# Patient Record
Sex: Male | Born: 1980 | Race: Black or African American | Hispanic: No | Marital: Single | Smoking: Current every day smoker
Health system: Southern US, Community
[De-identification: ages and names within clinical notes are randomized; demographics above are authoritative.]

---

## 2000-02-08 ENCOUNTER — Emergency Department (HOSPITAL_COMMUNITY): Admission: EM | Admit: 2000-02-08 | Discharge: 2000-02-08 | Payer: Self-pay | Admitting: Emergency Medicine

## 2010-04-30 ENCOUNTER — Emergency Department (HOSPITAL_COMMUNITY): Admission: EM | Admit: 2010-04-30 | Discharge: 2010-04-30 | Payer: Self-pay | Admitting: Emergency Medicine

## 2016-07-18 ENCOUNTER — Emergency Department (HOSPITAL_COMMUNITY)
Admission: EM | Admit: 2016-07-18 | Discharge: 2016-07-18 | Disposition: A | Discharge status: Home / Self Care | Payer: Self-pay | Attending: Emergency Medicine | Admitting: Emergency Medicine

## 2016-07-18 ENCOUNTER — Encounter (HOSPITAL_COMMUNITY): Payer: Self-pay

## 2016-07-18 ENCOUNTER — Emergency Department (HOSPITAL_COMMUNITY): Payer: Self-pay

## 2016-07-18 DIAGNOSIS — Z23 Encounter for immunization: Secondary | ICD-10-CM | POA: Insufficient documentation

## 2016-07-18 DIAGNOSIS — Y990 Civilian activity done for income or pay: Secondary | ICD-10-CM | POA: Insufficient documentation

## 2016-07-18 DIAGNOSIS — W458XXA Other foreign body or object entering through skin, initial encounter: Secondary | ICD-10-CM | POA: Insufficient documentation

## 2016-07-18 DIAGNOSIS — Y939 Activity, unspecified: Secondary | ICD-10-CM | POA: Insufficient documentation

## 2016-07-18 DIAGNOSIS — Y929 Unspecified place or not applicable: Secondary | ICD-10-CM | POA: Insufficient documentation

## 2016-07-18 DIAGNOSIS — L089 Local infection of the skin and subcutaneous tissue, unspecified: Secondary | ICD-10-CM | POA: Insufficient documentation

## 2016-07-18 DIAGNOSIS — S61212A Laceration without foreign body of right middle finger without damage to nail, initial encounter: Secondary | ICD-10-CM | POA: Insufficient documentation

## 2016-07-18 MED ORDER — TETANUS-DIPHTH-ACELL PERTUSSIS 5-2.5-18.5 LF-MCG/0.5 IM SUSP
0.5000 mL | Freq: Once | INTRAMUSCULAR | Status: AC
  Administered 2016-07-18: 0.5 mL via INTRAMUSCULAR
  Filled 2016-07-18: qty 0.5

## 2016-07-18 MED ORDER — SULFAMETHOXAZOLE-TRIMETHOPRIM 800-160 MG PO TABS: 1.0000 | ORAL_TABLET | Freq: Two times a day (BID) | ORAL | 0 refills | Status: AC

## 2016-07-18 MED ORDER — LIDOCAINE HCL (PF) 1 % IJ SOLN: 30.0000 mL | Freq: Once | INTRAMUSCULAR | Status: DC

## 2016-07-18 MED ORDER — SULFAMETHOXAZOLE-TRIMETHOPRIM 800-160 MG PO TABS
1.0000 | ORAL_TABLET | Freq: Once | ORAL | Status: AC
  Administered 2016-07-18: 1 via ORAL
  Filled 2016-07-18: qty 1

## 2016-07-18 MED ORDER — NAPROXEN 500 MG PO TABS: 500.0000 mg | ORAL_TABLET | Freq: Two times a day (BID) | ORAL | 0 refills | Status: AC

## 2016-07-18 NOTE — ED Provider Notes (Signed)
[facility]Y DEPT Provider Note   CSN: [id number] Arrival date & time: 07/18/16  1906  By signing my name below, I, Modena Jansky, attest that this documentation has been prepared under the direction and in the presence of non-physician practitioner, Kerrie Buffalo, NP. Electronically Signed: Modena Jansky, Scribe. 07/18/2016. 9:20 PM.  History   Chief Complaint Chief Complaint  Patient presents with  . Finger Injury   The history is provided by the patient. No language interpreter was used.  Laceration   The incident occurred 2 days ago. The laceration is located on the right hand. The laceration mechanism was a a metal edge. The pain is moderate. The pain has been fluctuating since onset. He reports no foreign bodies present. His tetanus status is unknown.   HPI Comments: Carl Moreno is a 35 y.o. male who presents to the Emergency Department complaining of a right middle finger wound that occurred 2 days ago. He states he cut his finger on a metal edge at work. Reports constant moderate pain that is exacerbated by movement. His tetanus status is not UTD. Denies any other complaints.  History reviewed. No pertinent past medical history.  There are no active problems to display for this patient.   History reviewed. No pertinent surgical history.     Home Medications    Prior to Admission medications   Medication Sig Start Date End Date Taking? Authorizing Provider  naproxen (NAPROSYN) 500 MG tablet Take 1 tablet (500 mg total) by mouth 2 (two) times daily. 07/18/16   Hope Orlene Och, NP  sulfamethoxazole-trimethoprim (BACTRIM DS,SEPTRA DS) 800-160 MG tablet Take 1 tablet by mouth 2 (two) times daily. 07/18/16 07/25/16  Hope Orlene Och, NP    Family History History reviewed. No pertinent family history.  Social History Social History  Substance Use Topics  . Smoking status: Current Every Day Smoker  . Smokeless tobacco: Never Used  . Alcohol use Yes     Allergies   Review of  patient's allergies indicates not on file.   Review of Systems Review of Systems  Musculoskeletal: Positive for arthralgias.       Finger pain and swelling   Skin: Positive for wound.  all other systems negative   Physical Exam Updated Vital Signs BP 123/88   Pulse 94   Temp 98.4 F (36.9 C) (Oral)   Resp 18   SpO2 100%   Physical Exam  Constitutional: He appears well-developed and well-nourished. No distress.  HENT:  Head: Normocephalic and atraumatic.  Eyes: Conjunctivae are normal.  Neck: Neck supple.  Cardiovascular: Normal rate.   Pulmonary/Chest: Effort normal.  Abdominal: Soft.  Musculoskeletal: Normal range of motion.  Neurological: He is alert.  Skin: Skin is warm and dry.  Healing laceration to the PIP on the dorsum of the middle finger on the right hand. There is swelling, tenderness and limited ROM due to pain. Radial pulse is +2. Adequate circulation and sensation to touch.   Psychiatric: He has a normal mood and affect.  Nursing note and vitals reviewed.    ED Treatments / Results  DIAGNOSTIC STUDIES: Oxygen Saturation is 100% on RA, normal by my interpretation.    COORDINATION OF CARE: 9:24 PM- Pt advised of plan for treatment and pt agrees.  Labs (all labs ordered are listed, but only abnormal results are displayed) Labs Reviewed - No data to display   Radiology Dg Finger Middle Right  Result Date: 07/18/2016 CLINICAL DATA:  Injury to the right third finger with pain  and swelling 2 days ago. EXAM: RIGHT MIDDLE FINGER 2+V COMPARISON:  None. FINDINGS: There is no evidence of fracture or dislocation. There is no evidence of arthropathy or other focal bone abnormality. Soft tissues are unremarkable. There is no radiopaque foreign body. IMPRESSION: Negative. Electronically Signed   By: Sherian ReinWei-Chen  Lin M.D.   On: 07/18/2016 22:00    Procedures Procedures (including critical care time)  Medications Ordered in ED Medications  lidocaine (PF)  (XYLOCAINE) 1 % injection 30 mL (not administered)  Tdap (BOOSTRIX) injection 0.5 mL (0.5 mLs Intramuscular Given 07/18/16 2157)  sulfamethoxazole-trimethoprim (BACTRIM DS,SEPTRA DS) 800-160 MG per tablet 1 tablet (1 tablet Oral Given 07/18/16 2219)     Initial Impression / Assessment and Plan / ED Course  I have reviewed the triage vital signs and the nursing notes.  Pertinent labs & imaging results that were available during my care of the patient were reviewed by me and considered in my medical decision making (see chart for details).  Clinical Course   Dr. Amanda PeaGramig here to evaluate the patient. Wound cleaned, bacitracin ointment and dressing applied. Will start antibiotics and patient to f/u in the office.  Tdap booster given. .   Bactrim DS  Final Clinical Impressions(s) / ED Diagnoses  35 y.o. male with infected finger stable for d/c with focal neuro deficits. Discussed with the patient plan of care and all questioned fully answered.    Final diagnoses:  Infection of finger    New Prescriptions Discharge Medication List as of 07/18/2016 10:10 PM    START taking these medications   Details  naproxen (NAPROSYN) 500 MG tablet Take 1 tablet (500 mg total) by mouth 2 (two) times daily., Starting Tue 07/18/2016, Print    sulfamethoxazole-trimethoprim (BACTRIM DS,SEPTRA DS) 800-160 MG tablet Take 1 tablet by mouth 2 (two) times daily., Starting Tue 07/18/2016, Until Tue 07/25/2016, Print      I personally performed the services described in this documentation, which was scribed in my presence. The recorded information has been reviewed and is accurate.     [place] M Ne[place], [place]NP 07/18/16 2330    Jerelyn ScottMartha Linker, MD 07/18/16 [phone]

## 2016-07-18 NOTE — Discharge Instructions (Signed)
You have an infection of your finger. Dr. Amanda PeaGramig wants you to call the office to schedule follow up for next week. If you have problems he wants to see you sooner. Call tomorrow to schedule your appointment.  Take the antibiotics as directed.

## 2016-07-18 NOTE — ED Triage Notes (Signed)
Pt states cut R third finger two days ago. Pt states cut on metal at work. Unknown last tetanus shot. Pt complaining of difficulty bending finger. Pt with good CMS distal to injury.

## 2016-07-19 NOTE — Consult Note (Signed)
NAMChristene Moreno:  Carl Moreno, Carl Moreno                 ACCOUNT NO.:  [id number]  MEDICAL RECORD NO.:  [id number]  LOCATION:  TR07C                        FACILITY:  [facility]  PHYSICIAN:  Dionne AnoWilliam M. Ailis Rigaud, M.D.DATE OF BIRTH:  [birth year]  DATE OF CONSULTATION: DATE OF DISCHARGE:  07/18/2016                                CONSULTATION   HISTORY OF PRESENT ILLNESS:  Carl FabryJeremy Moreno is 35 year old male, who [facility] for evaluation of his finger. 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  The patient complains of middle finger pain about the right hand after injuring it.  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  A piece of gamma knife type steel on the front of the assembly hit his finger.  He states it bled.  This was Sunday and it is now Tuesday.  He states it bled.  At the present time, he does not have an open wound. He has an area of small swelling but no erythema.  I have reviewed this with him at length.  He denies fever, chills, nausea, vomiting.  PAST MEDICAL HISTORY:  Is notable for no known drug allergies it appears.  He has no significant problem list when reviewing his chart.  SOCIAL HISTORY:  He does smoke.  HE occasionally enjoys an alcoholic beverage.  FAMILY HISTORY:  Noncontributory.  PHYSICAL EXAMINATION:  GENERAL:  He is alert and oriented, in no acute distress, very pleasant. VITAL SIGNS:  Stable. HEENT:  Within normal limits. CHEST:  Clear. ABDOMEN:  Nontender. EXTREMITIES:  Lower extremity examination is benign.  Right middle finger has a small area of swelling.  No erythema.  No signs of a deep abscess or infection.  FDP, FDS extensor function is intact.  There is certainly a fullness but certainly not obvious localized abscess.  DIAGNOSTIC DATA:  His x-rays are negative on AP and lateral reviews.  I have reviewed this at length and the findings.  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  PLAN:  He is going to be started on antibiotics in the form of Bactrim given his findings.  I have discussed with him washing, lavage, and specifically went over how to dress the wound as I performed this myself with him at bedside.  He tolerated this well.  There were no complicating features.  Following this, I discussed with him elevation, twice a day washings and dressing changes.  He will return to see us in approximately 5-7 days or immediately should this worsen.  This certainly could declare itself, but I do feel that at present time this is not worthy of a surgical event.     Dionne AnoWilliam M. Amanda PeaGramig, M.D.     [facility]  D:  07/18/2016  T:  07/19/2016  Job:  [id number]

## 2017-12-21 IMAGING — CR DG FINGER MIDDLE 2+V*R*
3 series · 3 of 3 positions shown · non-contrast
Comparison: None.

CLINICAL DATA: Injury to the right third finger with pain and
swelling 2 days ago.

EXAM:
RIGHT MIDDLE FINGER 2+V

[finger ap]
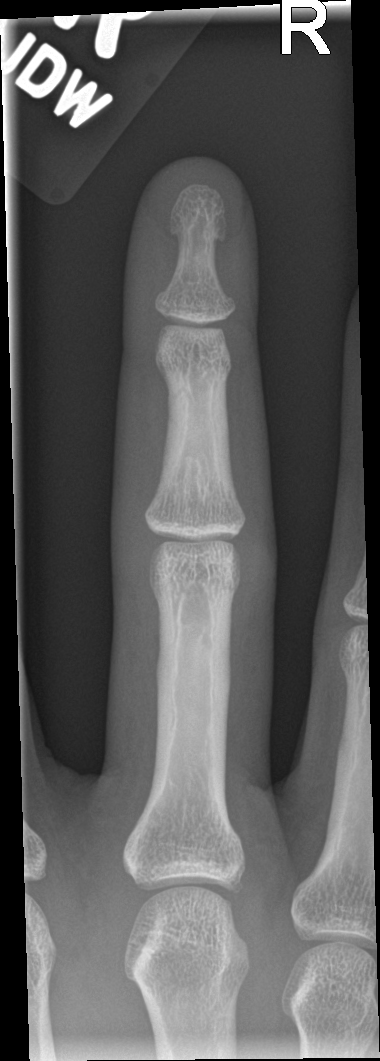

[finger obl]
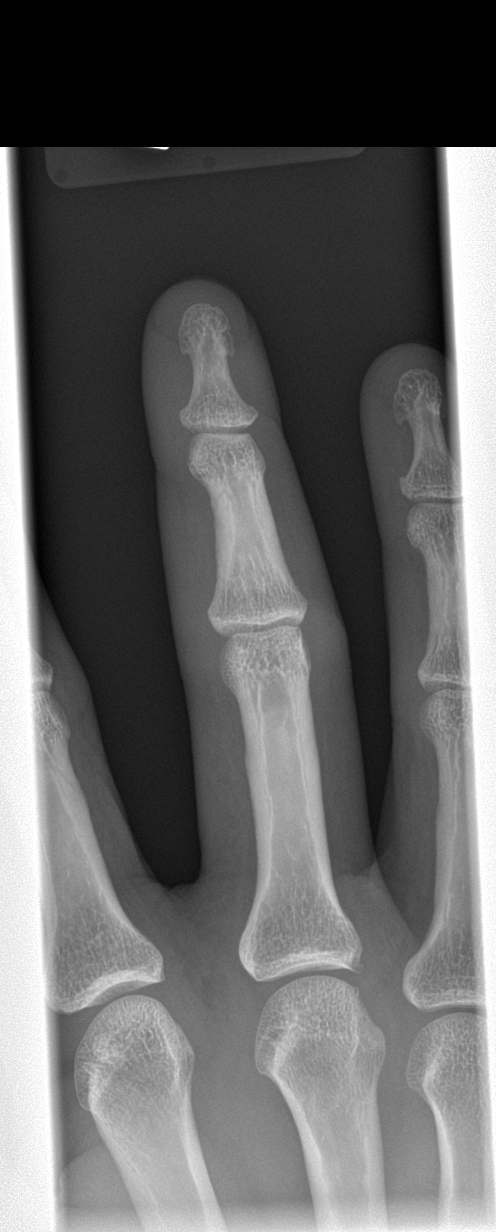

[finger lat]
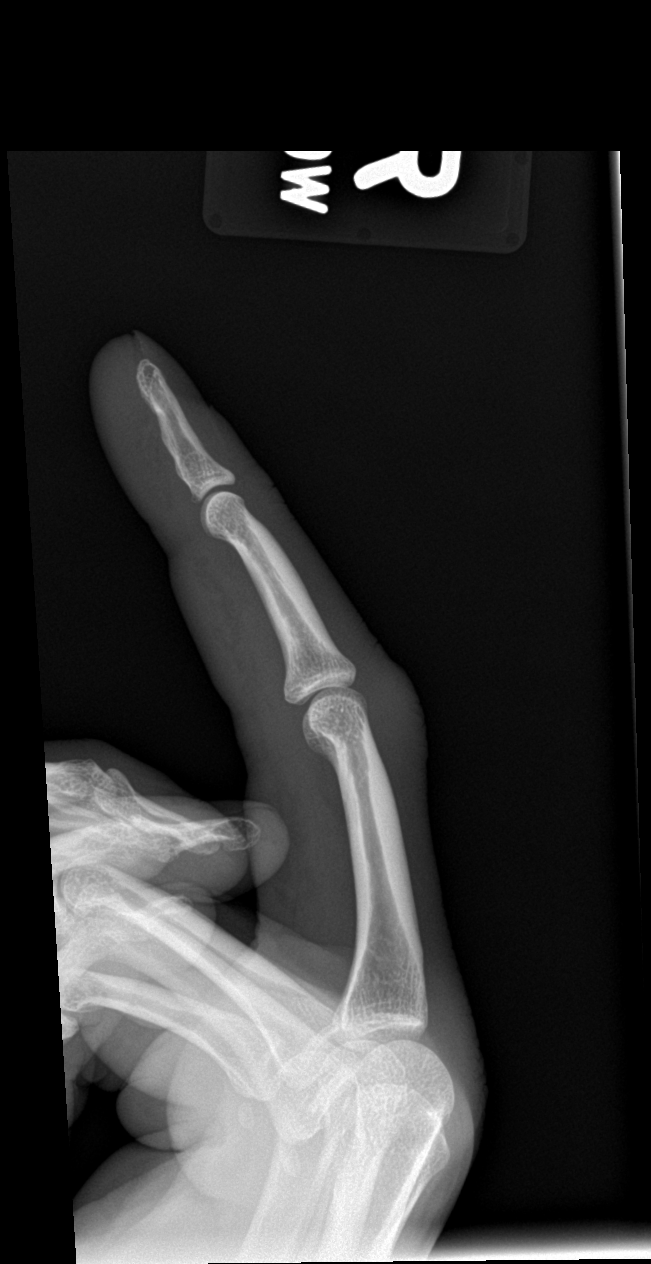

[3 of 3 positions shown; findings below may reference images not displayed]

FINDINGS: There is no evidence of fracture or dislocation. There is no
evidence of arthropathy or other focal bone abnormality. Soft
tissues are unremarkable. There is no radiopaque foreign body.
IMPRESSION: Negative.
